# Patient Record
Sex: Female | Born: 1960 | Race: White | Hispanic: No | Marital: Married | Smoking: Never smoker
Health system: Southern US, Community
[De-identification: ages and names within clinical notes are randomized; demographics above are authoritative.]

## PROBLEM LIST (undated history)

## (undated) DIAGNOSIS — G43909 Migraine, unspecified, not intractable, without status migrainosus: Secondary | ICD-10-CM

## (undated) HISTORY — DX: Migraine, unspecified, not intractable, without status migrainosus: G43.909

---

## 1994-09-18 HISTORY — PX: BREAST LUMPECTOMY: SHX2

## 2000-08-08 ENCOUNTER — Ambulatory Visit (HOSPITAL_BASED_OUTPATIENT_CLINIC_OR_DEPARTMENT_OTHER): Admission: RE | Admit: 2000-08-08 | Discharge: 2000-08-08 | Payer: Self-pay | Admitting: Plastic Surgery

## 2004-09-13 ENCOUNTER — Encounter: Admission: RE | Admit: 2004-09-13 | Discharge: 2004-09-13 | Payer: Self-pay | Admitting: *Deleted

## 2004-09-27 ENCOUNTER — Encounter: Admission: RE | Admit: 2004-09-27 | Discharge: 2004-09-27 | Payer: Self-pay | Admitting: *Deleted

## 2004-09-28 ENCOUNTER — Ambulatory Visit: Payer: Self-pay | Admitting: Oncology

## 2004-09-28 ENCOUNTER — Encounter: Admission: RE | Admit: 2004-09-28 | Discharge: 2004-09-28 | Payer: Self-pay

## 2004-09-29 ENCOUNTER — Encounter (INDEPENDENT_AMBULATORY_CARE_PROVIDER_SITE_OTHER): Payer: Self-pay | Admitting: *Deleted

## 2004-09-29 ENCOUNTER — Encounter: Admission: RE | Admit: 2004-09-29 | Discharge: 2004-09-29 | Payer: Self-pay | Admitting: *Deleted

## 2004-11-28 ENCOUNTER — Ambulatory Visit: Payer: Self-pay | Admitting: Oncology

## 2007-02-25 ENCOUNTER — Encounter: Admission: RE | Admit: 2007-02-25 | Discharge: 2007-02-25 | Payer: Self-pay | Admitting: Cardiology

## 2007-03-10 ENCOUNTER — Encounter: Admission: RE | Admit: 2007-03-10 | Discharge: 2007-03-10 | Payer: Self-pay | Admitting: Orthopedic Surgery

## 2007-03-14 ENCOUNTER — Ambulatory Visit: Payer: Self-pay | Admitting: Internal Medicine

## 2007-03-18 ENCOUNTER — Ambulatory Visit: Payer: Self-pay | Admitting: Internal Medicine

## 2007-03-18 LAB — CONVERTED CEMR LAB
ALT: 20 units/L (ref 0–35)
AST: 23 units/L (ref 0–37)
Albumin: 3.7 g/dL (ref 3.5–5.2)
Alkaline Phosphatase: 37 units/L — ABNORMAL LOW (ref 39–117)
Bilirubin, Direct: 0.1 mg/dL (ref 0.0–0.3)
Total Bilirubin: 0.7 mg/dL (ref 0.3–1.2)
Total Protein: 6.6 g/dL (ref 6.0–8.3)

## 2008-01-18 DIAGNOSIS — D1803 Hemangioma of intra-abdominal structures: Secondary | ICD-10-CM | POA: Insufficient documentation

## 2008-01-18 HISTORY — DX: Hemangioma of intra-abdominal structures: D18.03

## 2008-02-11 IMAGING — US US ABDOMEN COMPLETE
1 series · 13 of 25 positions shown · non-contrast
Comparison: none

ACCESSION NUMBER:  [id number].

 READING PHYSICIAN:  Ronlor, Ingunn Harpa.
 PROCEDURE:  Multiplanar abdominal ultrasound imaging was performed in the upright, supine, right and left lateral decubitus positions.
 INDICATIONS FOR PROCEDURE:  Abnormal MRI with cysts on the livers.
 RESULTS:  ABDOMINAL AORTA:  Abdominal aorta is 16 mm. The abdominal aorta appears normal.  The IVC is patent. 
 PANCREAS:  The pancreas appears normal throughout the head, body and tail without evidence of ductal dilatation, pancreatic masses or peripancreatic inflammation. 
 GALLBLADDER:  Gallbladder is well distended, thin walled, with no pericholecystic fluid or intraluminal echogenic foci to suggest gallstone disease. Normal wall thickness of 2.7 mm. 
 COMMON BILE DUCT:  The common bile duct measures 2.2 mm in maximal diameter and appears normal without evidence of intraluminal foci. 
 LIVER:  The liver appears normal without evidence of parenchymal lesion, ductal dilatation or vascular abnormality. Normal size and normal echodensity with 2 hyperechoic areas in the right lobe of the liver, 117 mm and 115 mm in diameter. No evidence of capsule or cystic lesion. Both appear consistent with hemangioma's.
 KIDNEYS:  Kidneys measure 99 mm right, 102 mm left.  Both kidneys are normal in appearance. 
 SPLEEN:  The spleen is normal in size, measuring 92 mm without parenchymal lesion.

[Series 1: us abdomen complete · 0.26mm/px · 13 of 58 slices shown]
[im 1/58]
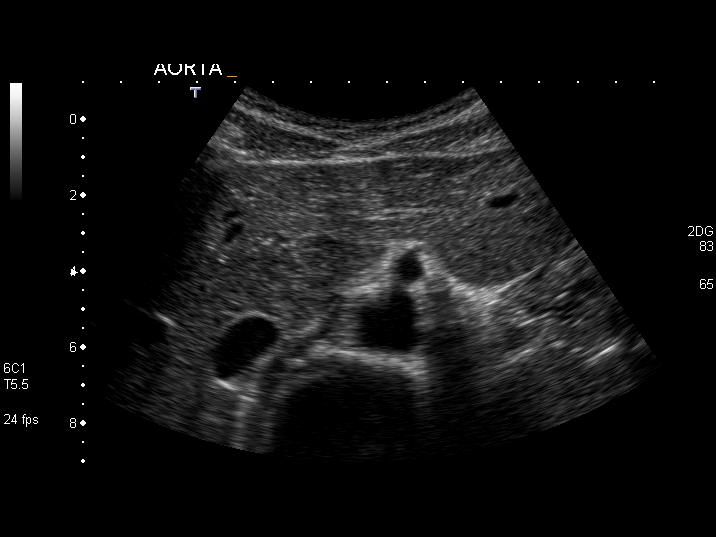
[im 5/58]
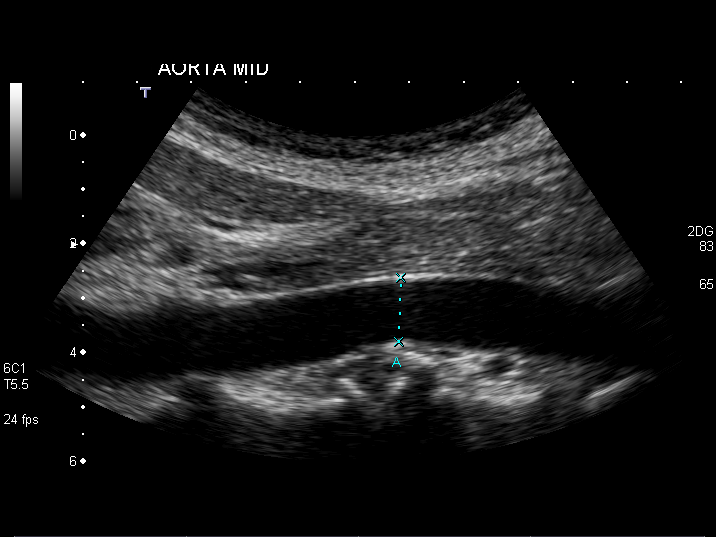
[im 10/58]
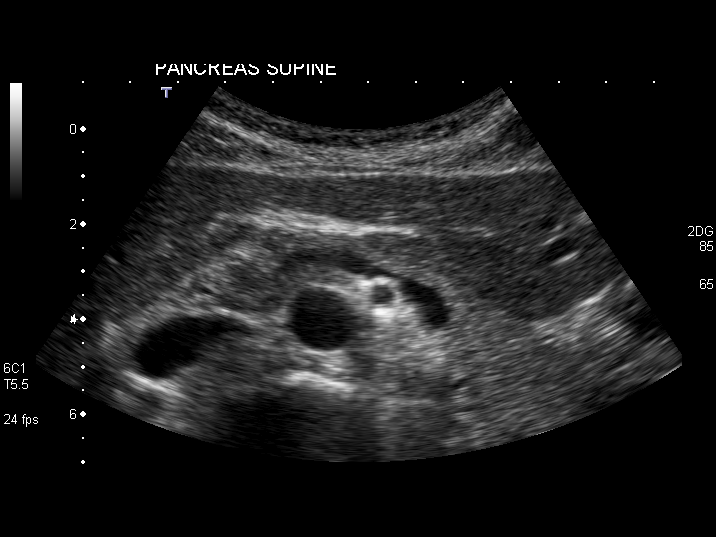
[im 15/58]
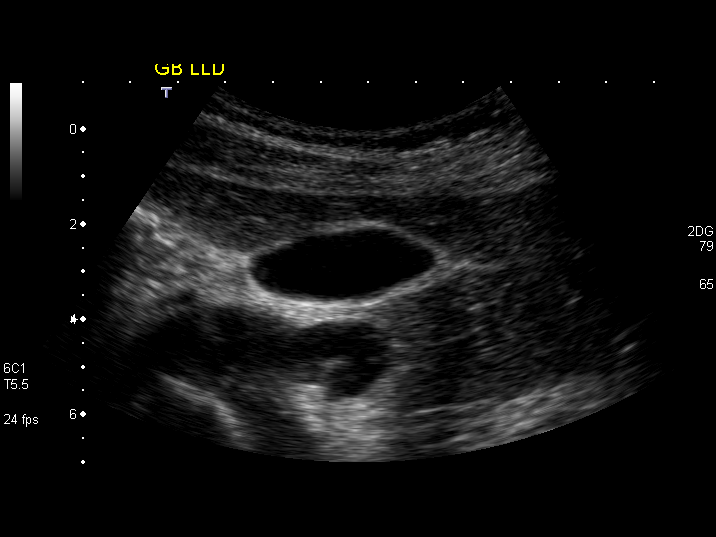
[im 20/58]
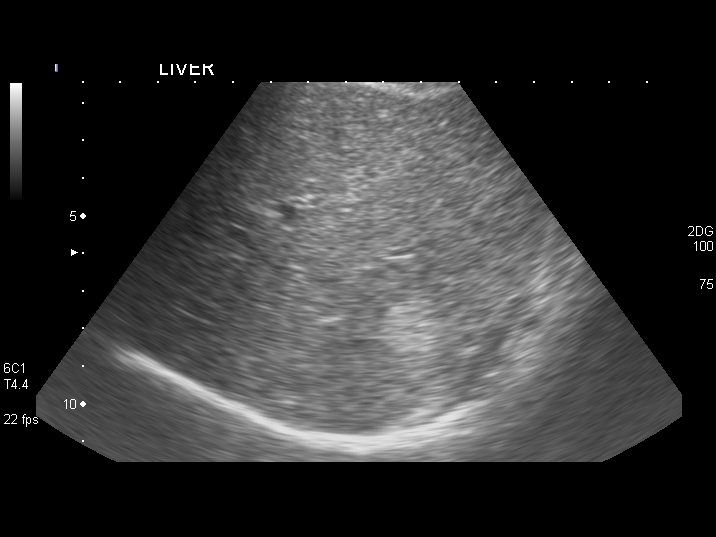
[im 24/58]
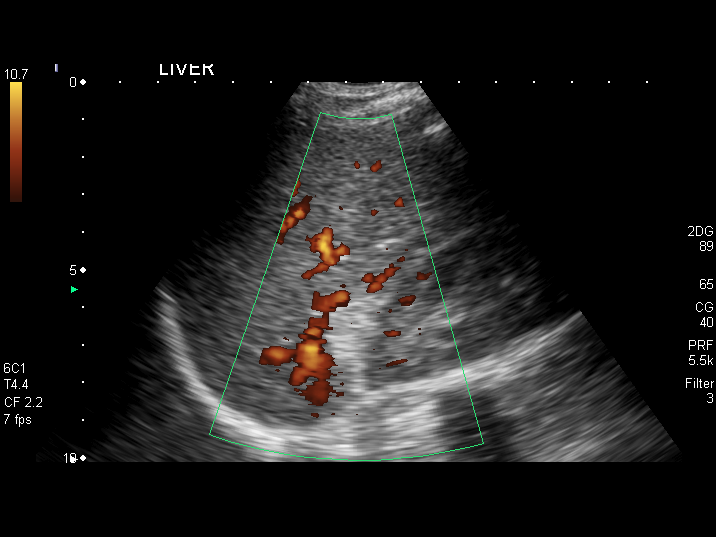
[im 29/58]
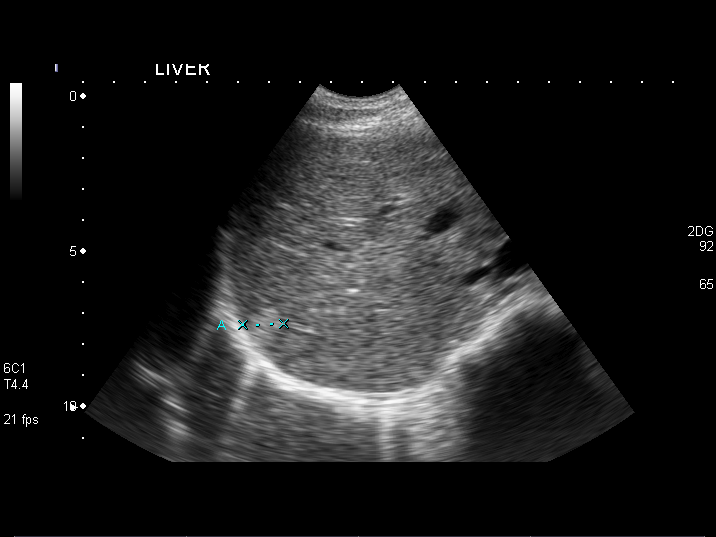
[im 34/58]
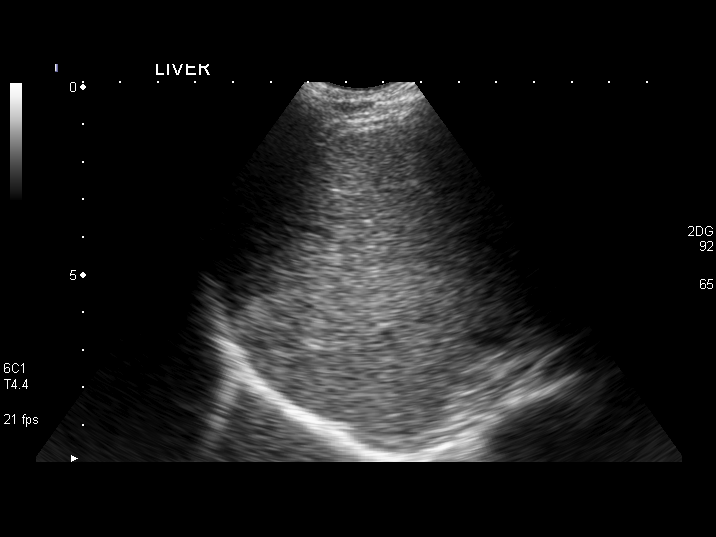
[im 39/58]
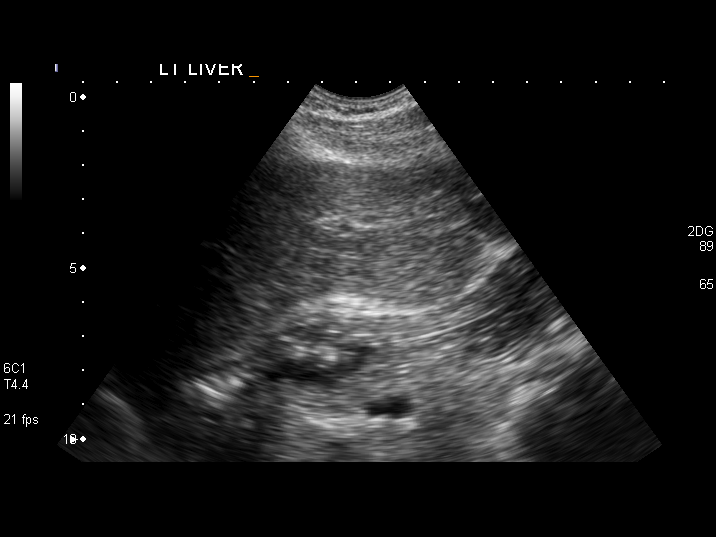
[im 43/58]
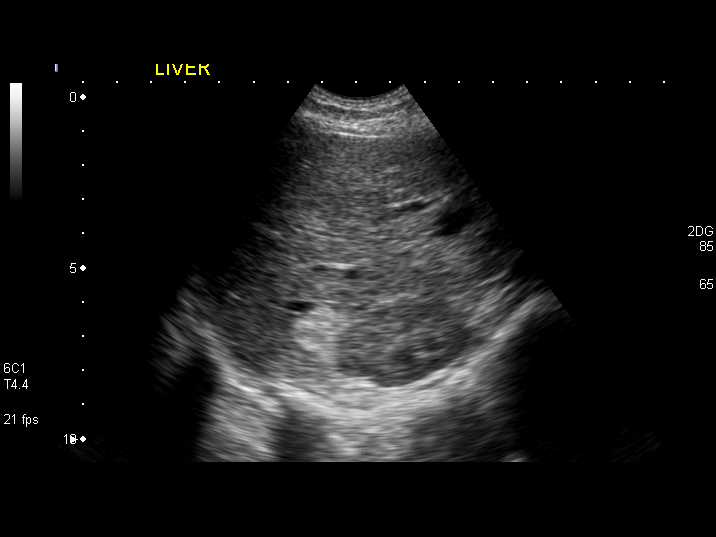
[im 48/58]
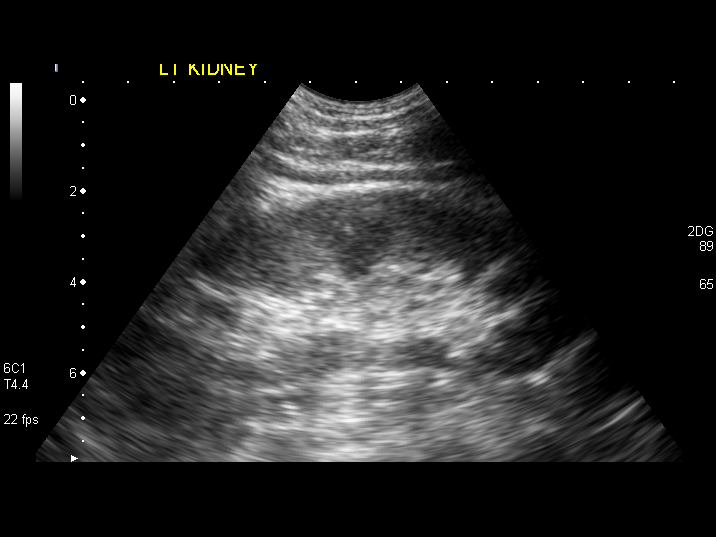
[im 53/58]
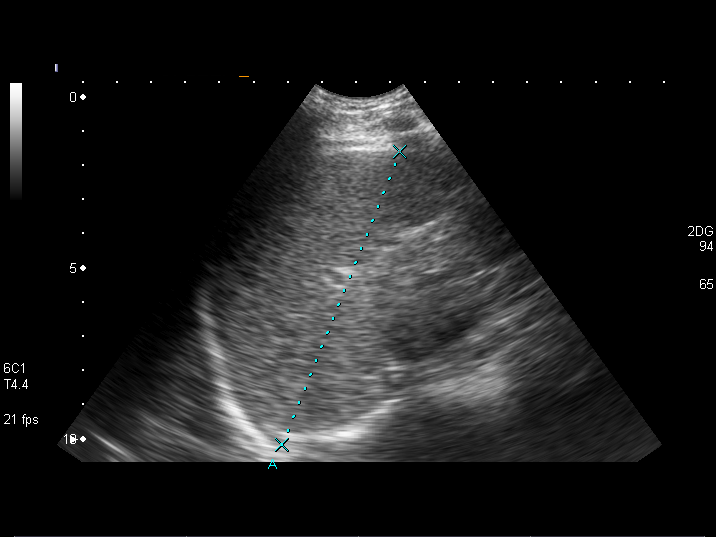
[im 58/58]
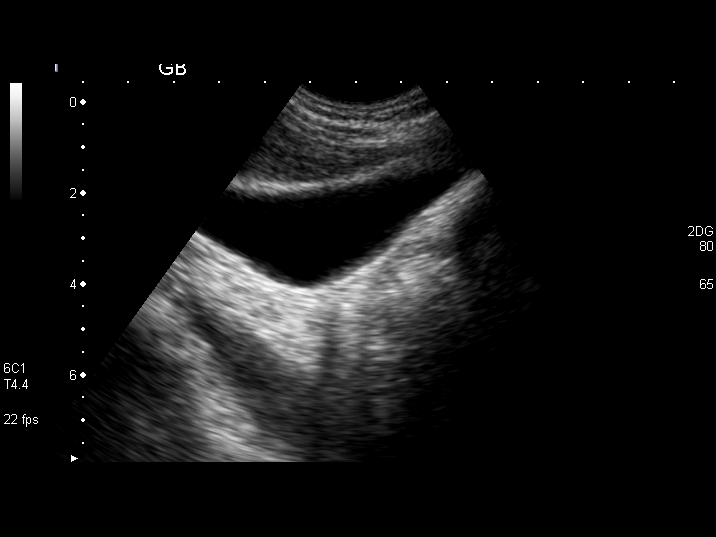

[13 of 25 positions shown; findings below may reference images not displayed]

IMPRESSION: Two (2) liver lesions, right lobe of the liver, 15 mm and 17 mm, both consistent with hemangioma's, to focal nodular hyperplasia, less likely adenoma's.

 PLAN:  MRI of the liver.

## 2009-12-14 ENCOUNTER — Encounter: Payer: Self-pay | Admitting: Cardiology

## 2010-01-10 ENCOUNTER — Encounter: Payer: Self-pay | Admitting: Cardiology

## 2010-01-10 DIAGNOSIS — D649 Anemia, unspecified: Secondary | ICD-10-CM

## 2010-01-10 HISTORY — DX: Anemia, unspecified: D64.9

## 2010-06-29 ENCOUNTER — Ambulatory Visit: Payer: Self-pay | Admitting: Internal Medicine

## 2010-06-29 ENCOUNTER — Ambulatory Visit (HOSPITAL_COMMUNITY): Admission: RE | Admit: 2010-06-29 | Discharge: 2010-06-29 | Payer: Self-pay | Admitting: Internal Medicine

## 2010-10-05 ENCOUNTER — Encounter: Payer: Self-pay | Admitting: Cardiology

## 2010-10-05 DIAGNOSIS — M810 Age-related osteoporosis without current pathological fracture: Secondary | ICD-10-CM | POA: Insufficient documentation

## 2010-10-05 HISTORY — DX: Age-related osteoporosis without current pathological fracture: M81.0

## 2010-10-09 ENCOUNTER — Encounter: Payer: Self-pay | Admitting: Internal Medicine

## 2010-10-18 NOTE — Miscellaneous (Signed)
Summary: Orders Update  Clinical Lists Changes  Problems: Added new problem of ANEMIA (ICD-285.9) Orders: Added new Test order of T-Comprehensive Metabolic Panel [phone]) - Signed Added new Test order of T-CBC No Diff ([id number]) - Signed Added new Test order of T-Protime, Auto [phone]) - Signed Added new Test order of T-PTT [phone]) - Signed Added new Test order of T-TSH [phone]) - Signed Added new Test order of T-Sed Rate (Automated) [phone]) - Signed Added new Test order of T-Vitamin B12 ([id number]) - Signed Added new Test order of T-Iron [phone]) - Signed Added new Test order of T-Ferritin ([id number]) - Signed Added new Test order of T- * Misc. Laboratory test [phone]) - Signed Added new Test order of T-Folic Acid; RBC ([id number]) - Signed Added new Test order of T-Hemoccult Cards-Multiple (22025) - Signed

## 2010-10-18 NOTE — Miscellaneous (Signed)
Summary: rx - iron  Clinical Lists Changes  Medications: Added new medication of FERROUS SULFATE 325 (65 FE) MG TBEC (FERROUS SULFATE) Take 1 tablet by mouth three times a day - Signed Rx of FERROUS SULFATE 325 (65 FE) MG TBEC (FERROUS SULFATE) Take 1 tablet by mouth three times a day;  #90 x 3;  Signed;  Entered by: Hoover Brunette, LPN;  Authorized by: Lewayne Bunting, MD, [facility];  Method used: Electronically to [facility] [phone]*, [address], [place], [place], [place]  [address], Ph: [phone] or [phone], Fax: [phone]    Prescriptions: FERROUS SULFATE 325 (65 FE) MG TBEC (FERROUS SULFATE) Take 1 tablet by mouth three times a day  #90 x 3   Entered by:   Hoover Brunette, LPN   Authorized by:   Lewayne Bunting, MD, [facility]   Signed by:   Hoover Brunette, LPN on 41/66/0630   Method used:   Electronically to        [facility] [address] [phone]* (retail)       [address]       [place], [place]  [address]       Ph: [phone] or [phone]       Fax: [phone]   RxID:   [id number]

## 2010-10-20 NOTE — Miscellaneous (Signed)
Summary: Orders Update  Clinical Lists Changes  Problems: Added new problem of OSTEOPOROSIS (ICD-733.00) Orders: Added new Test order of T-Ferritin [phone]) - Signed Added new Test order of T- * Misc. Laboratory test [phone]) - Signed

## 2011-01-31 NOTE — Assessment & Plan Note (Signed)
[facility]                         GASTROENTEROLOGY OFFICE NOTE   NAME:[name], [name]                         MRN:          [id number]  DATE:03/18/2007                            DOB:          26-Nov-1960    [name] [name] is a 50 year old white female, wife of Dr. Andee Lineman, who is here  today to evaluate abnormality of a final MRI of the thoracic spine  ordered by Dr. Andee Lineman, which was done on February 25, 2007 and which showed  incidental hyperintense lesion in the posterior liver on the right,  measuring 10x15 mm.  It was not clear from the MRI whether this was a  cystic lesion, a solid lesion.   [name] [name] is asymptomatic as far as the liver lesions are concerned.  She  has never had hepatitis, jaundice, GI symptoms suggestive of gallbladder  disease.  She took birth control pills for a period of 2 or 3 years in  her 85s.   Other medications include multivitamins and calcium supplements.   PAST HISTORY:  Negative for cardiovascular, thyroid, or  genitourinary  problems.   OPERATIONS:  None.   FAMILY HISTORY:  Positive for prostate cancer in her father.   SOCIAL HISTORY:  Married, no children.  She is a Designer, jewellery.  She  drinks alcohol socially and does not smoke.   REVIEW OF SYSTEMS:  Positive for night sweats.   PHYSICAL EXAMINATION:  Blood pressure 100/64, pulse 72 and weight 109  pounds.  She was very pleasant and healthy appearing.  SKIN:  No stigmata of chronic liver disease. Specifically no palmar  erythema, spider nevi.  Sclerae is nonicteric.  NECK:  Supple, no lymphadenopathy.  LUNGS:  Clear to auscultation.  COR:  With normal S1, normal S2.  ABDOMEN:  Soft, scaphoid,  nontender.  Liver edge was at costal margin  and by percussion measured about 6 to 7 cm overall. It was not tender.  EXTREMITIES:  No edema.   Upper abdominal ultrasound done in our office last week in order to  further evaluate the lesions showed actually 2 solid lesions  suggestive  of hemangioma, both in right lobe of the liver measuring 17 mm and 15 mm  respectively.  There was no encapsulation.  There were no dilated intra-  hepatic ducts or any other focal lesions.  Liver size was normal.  Gallbladder also appeared normal.  Splenic size was normal.  The  differential diagnosis of the lesions was focal nodular hyperplasia or  possibly adenoma.   IMPRESSION:  A 50 year old white female with incidental findings of 2  small lesions in the right lobe of the liver, defined as most likely to  be hemangioma, although there is a possibility that these could  represent focal nodule hyperplasia or area of adenomas.  The appearance  of them is more suggestive of hemangioma.   PLAN:  I have discussed the options for a definitive diagnosis with  [name] [name] as well as her husband, [name] [name].  I would prefer to follow these  lesions and to obtain at least 1 ultrasound in a period of 1 year to  assure that these lesions remained the same size.  In addition to that,  we will obtain liver function test today.  The other option would be to  go ahead with dedicated MRI of the liver, which would further define the  lesions as either vascular or nonvascular.  Considering the fact that  she already had an MRI within the last 4 weeks, I would lean towards the  more conservative approach.  If her upper abdominal ultrasound showed  this lesion to be exactly the same in 1 year and her liver function  tests are normal, we will either repeat the ultrasound in 3 to 5 years  following that or just not follow it in any standard way.  [name] [name] was  satisfied with the plan.  We will send her a reminder letter for the  above abdominal ultrasound for next June 2009.  As far as using estrogen  for birth control pills, I would stay away from them for the time being,  since we really do not know whether these lesions may not be estrogen  sensitive.     [name] [name]. [name] Chance, MD  Electronically  Signed    DMB/MedQ  DD: 03/18/2007  DT: 03/18/2007  Job #: [id number]   cc:   Learta Codding, MD,FACC  G. Dorene Grebe, M.D.

## 2011-07-24 ENCOUNTER — Other Ambulatory Visit: Payer: Self-pay | Admitting: *Deleted

## 2011-07-24 MED ORDER — FERROUS SULFATE 325 (65 FE) MG PO TABS: 325.0000 mg | ORAL_TABLET | Freq: Three times a day (TID) | ORAL | Status: DC

## 2011-12-20 ENCOUNTER — Other Ambulatory Visit: Payer: Self-pay | Admitting: *Deleted

## 2011-12-20 DIAGNOSIS — D649 Anemia, unspecified: Secondary | ICD-10-CM

## 2011-12-21 ENCOUNTER — Other Ambulatory Visit: Payer: Self-pay | Admitting: Cardiology

## 2011-12-21 LAB — CBC
HCT: 36.5 % (ref 36.0–46.0)
Hemoglobin: 11.7 g/dL — ABNORMAL LOW (ref 12.0–15.0)
MCH: 29.6 pg (ref 26.0–34.0)
MCHC: 32.1 g/dL (ref 30.0–36.0)
MCV: 92.4 fL (ref 78.0–100.0)
Platelets: 304 10*3/uL (ref 150–400)
RBC: 3.95 MIL/uL (ref 3.87–5.11)
RDW: 13.5 % (ref 11.5–15.5)
WBC: 6.9 10*3/uL (ref 4.0–10.5)

## 2011-12-21 LAB — FERRITIN: Ferritin: 16 ng/mL (ref 10–291)

## 2012-02-14 ENCOUNTER — Other Ambulatory Visit: Payer: Self-pay | Admitting: Plastic Surgery

## 2014-12-31 ENCOUNTER — Other Ambulatory Visit: Payer: Self-pay | Admitting: Surgery

## 2017-03-01 ENCOUNTER — Ambulatory Visit (INDEPENDENT_AMBULATORY_CARE_PROVIDER_SITE_OTHER): Payer: BLUE CROSS/BLUE SHIELD | Admitting: Podiatry

## 2017-03-01 ENCOUNTER — Ambulatory Visit (INDEPENDENT_AMBULATORY_CARE_PROVIDER_SITE_OTHER): Payer: BLUE CROSS/BLUE SHIELD

## 2017-03-01 ENCOUNTER — Encounter: Payer: Self-pay | Admitting: Podiatry

## 2017-03-01 VITALS — BP 103/74 | HR 75 | Resp 16 | Ht 64.0 in | Wt 116.0 lb

## 2017-03-01 DIAGNOSIS — M79671 Pain in right foot: Secondary | ICD-10-CM

## 2017-03-01 DIAGNOSIS — M21619 Bunion of unspecified foot: Secondary | ICD-10-CM

## 2017-03-01 DIAGNOSIS — M2041 Other hammer toe(s) (acquired), right foot: Secondary | ICD-10-CM

## 2017-03-01 DIAGNOSIS — M79672 Pain in left foot: Principal | ICD-10-CM

## 2017-03-01 DIAGNOSIS — D492 Neoplasm of unspecified behavior of bone, soft tissue, and skin: Secondary | ICD-10-CM

## 2017-03-01 NOTE — Progress Notes (Signed)
   Subjective:    Patient ID: [name] [name], female    DOB: 1960/11/23, 56 y.o.   MRN: [id number]  HPI Chief Complaint  Patient presents with  . Foot Pain    Bilateral; bunions  . Cyst    Right foot; medial side-below great toe; pt stated, "it is not hard and feels like it is on a tendong"; x3 months      Review of Systems  All other systems reviewed and are negative.      Objective:   Physical Exam        Assessment & Plan:

## 2017-03-01 NOTE — Patient Instructions (Signed)

## 2017-03-02 NOTE — Progress Notes (Signed)
Subjective:    Patient ID: Pearson Forster, female   DOB: 56 y.o.   MRN: [id number]   HPI patient presents stating I have a cyst on my right first metatarsal and also I have bunion deformities and I know immediately get fixed right over left with increased pain and I've tried to wear wider shoes and other modalities    Review of Systems  All other systems reviewed and are negative.       Objective:  Physical Exam  Cardiovascular: Intact distal pulses.   Musculoskeletal: Normal range of motion.  Neurological: She is alert.  Skin: Skin is warm.  Nursing note and vitals reviewed.  neurovascular status intact muscle strength adequate range of motion within normal limits with patient found to have large bunion deformity right over left with redness around the joint surface and the cyst proximal to the first metatarsal measuring approximately 1 cm x 1 cm that appears to be freely movable within subcutaneous tissue. 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 At this point I do think long-term surgical intervention is indicated she does want to get this done but needs to wait until later in the year and we'll focus on the cyst first. I did a proximal nerve block I then aspirated getting out a gelatinous material and injected a small amount steroid and applied compression dressing. I also discussed sites bunion correction right which will have to be aggressive and may not get complete clinical correction A digital procedure distal second right with removal of mucoid cyst. Patient will reappoint for consult  X-ray indicates that there is elevation of the intermetatarsal angle both  right and left foot a proximally 17

## 2022-05-28 DIAGNOSIS — N6489 Other specified disorders of breast: Secondary | ICD-10-CM | POA: Insufficient documentation

## 2022-06-15 ENCOUNTER — Telehealth: Payer: Self-pay

## 2022-06-15 DIAGNOSIS — I480 Paroxysmal atrial fibrillation: Secondary | ICD-10-CM

## 2022-06-15 NOTE — Telephone Encounter (Signed)
Called patient and left message informing her that I've put her on schedule to see Dr Burt Knack Tuesday 06/20/22 at 10am. Order placed at this time for stress ECHO.

## 2022-06-15 NOTE — Telephone Encounter (Signed)
-----   Message from Sherren Mocha, MD sent at 06/15/2022  9:26 AM EDT ----- [name] [name] - this is a new patient for me. Dx: paroxysmal atrial fibrillation. Can you set her up for a stress echo and OV with me next Tuesday? Stress echo whenever availability.   thx

## 2022-06-20 ENCOUNTER — Encounter: Payer: Self-pay | Admitting: Cardiovascular Disease

## 2022-06-20 ENCOUNTER — Ambulatory Visit: Payer: Managed Care, Other (non HMO)

## 2022-06-20 ENCOUNTER — Ambulatory Visit: Payer: Managed Care, Other (non HMO) | Attending: Cardiovascular Disease | Admitting: Cardiovascular Disease

## 2022-06-20 VITALS — BP 100/78 | HR 65 | Ht 64.0 in | Wt 125.0 lb

## 2022-06-20 DIAGNOSIS — I480 Paroxysmal atrial fibrillation: Secondary | ICD-10-CM

## 2022-06-20 MED ORDER — PROPRANOLOL HCL 20 MG PO TABS: 20.0000 mg | ORAL_TABLET | Freq: Three times a day (TID) | ORAL | 11 refills | Status: DC

## 2022-06-20 NOTE — Patient Instructions (Signed)
Medication Instructions:  STOP Flecainide START Propranolol '20mg'$  three times daily *If you need a refill on your cardiac medications before your next appointment, please call your pharmacy*   Lab Work: NONE If you have labs (blood work) drawn today and your tests are completely normal, you will receive your results only by: [place] (if you have MyChart) OR A paper copy in the mail If you have any lab test that is abnormal or we need to change your treatment, we will call you to review the results.   Testing/Procedures: 14 day Zio monitor Your physician has recommended that you wear an event monitor. Event monitors are medical devices that record the heart's electrical activity. Doctors most often [place] these monitors to diagnose arrhythmias. Arrhythmias are problems with the speed or rhythm of the heartbeat. The monitor is a small, portable device. You can wear one while you do your normal daily activities. This is usually used to diagnose what is causing palpitations/syncope (passing out).  Follow-Up: At [facility], you and your health needs are our priority.  As part of our continuing mission to provide you with exceptional heart care, we have created designated Provider Care Teams.  These Care Teams include your primary Cardiologist (physician) and Advanced Practice Providers (APPs -  Physician Assistants and Nurse Practitioners) who all work together to provide you with the care you need, when you need it.  Your next appointment:   6 months  The format for your next appointment:   In Person  Provider:   Sherren Mocha, MD  Other Instructions ZIO XT- Long Term Monitor Instructions  Your physician has requested you wear a ZIO patch monitor for 14 days.  This is a single patch monitor. [employer] supplies one patch monitor per enrollment. Additional stickers are not available. Please do not apply patch if you will be having a Nuclear Stress Test,  Echocardiogram,  Cardiac CT, MRI, or Chest Xray during the period you would be wearing the  monitor. The patch cannot be worn during these tests. You cannot remove and re-apply the  ZIO XT patch monitor.  Your ZIO patch monitor will be mailed 3 day USPS to your address on file. It may take 3-5 days  to receive your monitor after you have been enrolled.  Once you have received your monitor, please review the enclosed instructions. Your monitor  has already been registered assigning a specific monitor serial # to you.  Billing and Patient Assistance Program Information  We have supplied [employer] with any of your insurance information on file for billing purposes. [employer] offers a sliding scale Patient Assistance Program for patients that do not have  insurance, or whose insurance does not completely cover the cost of the ZIO monitor.  You must apply for the Patient Assistance Program to qualify for this discounted rate.  To apply, please call [employer] at [phone], select option 4, select option 2, ask to apply for  Patient Assistance Program. Theodore Demark will ask your household income, and how many people  are in your household. They will quote your out-of-pocket cost based on that information.  [employer] will also be able to set up a 57-month interest-free payment plan if needed.  Applying the monitor   Shave hair from upper left chest.  Hold abrader disc by orange tab. Rub abrader in 40 strokes over the upper left chest as  indicated in your monitor instructions.  Clean area with 4 enclosed alcohol pads. Let dry.  Apply patch as indicated  in monitor instructions. Patch will be placed under collarbone on left  side of chest with arrow pointing upward.  Rub patch adhesive wings for 2 minutes. Remove white label marked "1". Remove the white  label marked "2". Rub patch adhesive wings for 2 additional minutes.  While looking in a mirror, press and release button in center of patch. A small green light will   flash 3-4 times. This will be your only indicator that the monitor has been turned on.  Do not shower for the first 24 hours. You may shower after the first 24 hours.  Press the button if you feel a symptom. You will hear a small click. Record Date, Time and  Symptom in the Patient Logbook.  When you are ready to remove the patch, follow instructions on the last 2 pages of Patient  Logbook. Stick patch monitor onto the last page of Patient Logbook.  Place Patient Logbook in the blue and white box. Use locking tab on box and tape box closed  securely. The blue and white box has prepaid postage on it. Please place it in the mailbox as  soon as possible. Your physician should have your test results approximately 7 days after the  monitor has been mailed back to [facility].  Call [facility] at [phone] if you have questions regarding  your ZIO XT patch monitor. Call them immediately if you see an orange light blinking on your  monitor.  If your monitor falls off in less than 4 days, contact our Monitor department at [phone].  If your monitor becomes loose or falls off after 4 days call [employer] at [phone] for  suggestions on securing your monitor   Important Information About [employer]

## 2022-06-20 NOTE — Progress Notes (Unsigned)
Enrolled for [employer] to mail a ZIO XT long term holter monitor to the patients address on file.  

## 2022-06-20 NOTE — Progress Notes (Signed)
Cardiology Office Note:    Date:  06/20/2022   ID:  [name] [name], DOB September 12, 1961, MRN [id number]  PCP:  No primary care provider on file.   [place] Providers Cardiologist:  Sherren Mocha, MD     Referring MD: No ref. provider found   Chief Complaint  Patient presents with   Atrial Fibrillation    History of Present Illness:    [name] [name] is a 61 y.o. female presenting for evaluation of atrial fibrillation.  The patient is here alone today.  She has no personal history of hypertension, diabetes, hyperlipidemia, or coronary artery disease.  She has had episodes of heart palpitations intermittently over the past few years.  In January of this year she was awakened from sleep with fast heart rate and heart palpitations.  She recorded that event and I am able to review the rhythm strips today which confirm atrial fibrillation with RVR.  The episode lasted several hours and resolved with magnesium, potassium, and a dose of flecainide.  She subsequently has had more frequent heart palpitations but no other confirmed episodes of atrial fibrillation.  She started taking flecainide 50 mg twice daily on a regular basis about 2 weeks ago.  She has noted some improvement in her heart palpitations since that time.  She was switched to long-acting propranolol but felt like her symptoms were somewhat better controlled on the shorter acting form.  She has had no exertional symptoms of chest pain, chest pressure, or shortness of breath.  She has been otherwise healthy with no medical problems.  Past Medical History:  Diagnosis Date   ANEMIA 01/10/2010   Qualifier: Diagnosis of  By: Laurance Flatten RN, BSN, [facility]     LIVER HEMANGIOMA 01/18/2008   Qualifier: Diagnosis of  By: Marland Mcalpine     Migraines    OSTEOPOROSIS 10/05/2010   Qualifier: Diagnosis of  By: Berdine Addison LPN, Edd Fabian      Past Surgical History:  Procedure Laterality Date   BREAST LUMPECTOMY  1996    Current  Medications: Current Meds  Medication Sig   Multiple Vitamin (MULTIVITAMIN) tablet Take 1 tablet by mouth daily.   propranolol (INDERAL) 20 MG tablet Take 1 tablet (20 mg total) by mouth 3 (three) times daily.   [DISCONTINUED] flecainide (TAMBOCOR) 50 MG tablet Take 50 mg by mouth 2 (two) times daily.   [DISCONTINUED] propranolol ER (INDERAL LA) 60 MG 24 hr capsule Take 60 mg by mouth daily.     Allergies:   Patient has no known allergies.   Social History   Socioeconomic History   Marital status: Married    Spouse name: Not on file   Number of children: Not on file   Years of education: Not on file   Highest education level: Not on file  Occupational History   Not on file  Tobacco Use   Smoking status: Never   Smokeless tobacco: Never  Substance and Sexual Activity   Alcohol use: Not on file   Drug use: Not on file   Sexual activity: Not on file  Other Topics Concern   Not on file  Social History Narrative   Not on file   Social Determinants of Health   Financial Resource Strain: Not on file  Food Insecurity: Not on file  Transportation Needs: Not on file  Physical Activity: Not on file  Stress: Not on file  Social Connections: Not on file     Family History: The patient's family history includes Diabetes in  her maternal grandmother; Hypertension in her maternal grandmother; Kidney failure in her maternal grandmother; Prostate cancer in her father. No family history of atrial fibrillation  ROS:   Please see the history of present illness.    All other systems reviewed and are negative.  EKGs/Labs/Other Studies Reviewed:    The following studies were reviewed today: none  EKG:  EKG is ordered today.  The ekg ordered today demonstrates normal sinus rhythm 65 bpm, cannot rule out septal infarct age undetermined, otherwise normal.  Recent Labs: No results found for requested labs within last 365 days.  Recent Lipid Panel No results found for: "CHOL", "TRIG",  "HDL", "CHOLHDL", "VLDL", "LDLCALC", "LDLDIRECT"   Risk Assessment/Calculations:    CHA2DS2-VASc Score = 1   This indicates a 0.6% annual risk of stroke. The patient's score is based upon: CHF History: 0 HTN History: 0 Diabetes History: 0 Stroke History: 0 Vascular Disease History: 0 Age Score: 0 Gender Score: 1                Physical Exam:    VS:  BP 100/78 (BP Location: Left Arm, Patient Position: Sitting, Cuff Size: Normal)   Pulse 65   Ht '5\' 4"'$  (1.626 m)   Wt 125 lb (56.7 kg)   SpO2 98%   BMI 21.46 kg/m     Wt Readings from Last 3 Encounters:  06/20/22 125 lb (56.7 kg)  03/01/17 116 lb (52.6 kg)     GEN:  Well nourished, well developed in no acute distress HEENT: Normal NECK: No JVD; No carotid bruits LYMPHATICS: No lymphadenopathy CARDIAC: RRR, no murmurs, rubs, gallops RESPIRATORY:  Clear to auscultation without rales, wheezing or rhonchi  ABDOMEN: Soft, non-tender, non-distended MUSCULOSKELETAL:  No edema; No deformity  SKIN: Warm and dry NEUROLOGIC:  Alert and oriented x 3 PSYCHIATRIC:  Normal affect   ASSESSMENT:    1. PAF (paroxysmal atrial fibrillation) (HCC)    PLAN:    In order of problems listed above:  The patient has paroxysmal atrial fibrillation, currently controlled on flecainide and long-acting propranolol.  We discussed pros and cons of antiarrhythmic drug therapy and in her case it is primarily for symptom control.  After shared decision-making conversation, we have decided to discontinue flecainide.  We will change her back to propranolol 20 mg 3 times daily using the short acting form which she found to be more efficacious.  She has stopped routinely taking her migraine headache medicine and feels that this was contributing to her palpitations.  I am going to check a ZIO monitor for 2 weeks of monitoring.  This will help evaluate the presence of atrial fibrillation or other supraventricular arrhythmias.  I am going to check a stress  echocardiogram to evaluate for any structural or ischemic heart disease as she may require flecainide again in the future.  I plan to see the patient back in 6 months.  She has had lab monitoring done by her primary physician.  We will send for a copy of her labs so that we can review her thyroid function and other lab data.  With a CHA2DS2-VASc score of 1 and the only positive feature being female gender, she does not require oral anticoagulation.      Shared Decision Making/Informed Consent The risks [chest pain, shortness of breath, cardiac arrhythmias, dizziness, blood pressure fluctuations, myocardial infarction, stroke/transient ischemic attack, and life-threatening complications (estimated to be 1 in 10,000)], benefits (risk stratification, diagnosing coronary artery disease, treatment guidance) and alternatives of a  stress or dobutamine stress echocardiogram were discussed in detail with Ms. Fellner and she agrees to proceed.    Medication Adjustments/Labs and Tests Ordered: Current medicines are reviewed at length with the patient today.  Concerns regarding medicines are outlined above.  Orders Placed This Encounter  Procedures   LONG TERM MONITOR (3-14 DAYS)   EKG 12-Lead   Meds ordered this encounter  Medications   propranolol (INDERAL) 20 MG tablet    Sig: Take 1 tablet (20 mg total) by mouth 3 (three) times daily.    Dispense:  90 tablet    Refill:  11    Patient Instructions  Medication Instructions:  STOP Flecainide START Propranolol '20mg'$  three times daily *If you need a refill on your cardiac medications before your next appointment, please call your pharmacy*   Lab Work: NONE If you have labs (blood work) drawn today and your tests are completely normal, you will receive your results only by: [place] (if you have MyChart) OR A paper copy in the mail If you have any lab test that is abnormal or we need to change your treatment, we will call you to review the  results.   Testing/Procedures: 14 day Zio monitor Your physician has recommended that you wear an event monitor. Event monitors are medical devices that record the heart's electrical activity. Doctors most often [place] these monitors to diagnose arrhythmias. Arrhythmias are problems with the speed or rhythm of the heartbeat. The monitor is a small, portable device. You can wear one while you do your normal daily activities. This is usually used to diagnose what is causing palpitations/syncope (passing out).  Follow-Up: At [facility], you and your health needs are our priority.  As part of our continuing mission to provide you with exceptional heart care, we have created designated Provider Care Teams.  These Care Teams include your primary Cardiologist (physician) and Advanced Practice Providers (APPs -  Physician Assistants and Nurse Practitioners) who all work together to provide you with the care you need, when you need it.  Your next appointment:   6 months  The format for your next appointment:   In Person  Provider:   Sherren Mocha, MD  Other Instructions ZIO XT- Long Term Monitor Instructions  Your physician has requested you wear a ZIO patch monitor for 14 days.  This is a single patch monitor. [employer] supplies one patch monitor per enrollment. Additional stickers are not available. Please do not apply patch if you will be having a Nuclear Stress Test,  Echocardiogram, Cardiac CT, MRI, or Chest Xray during the period you would be wearing the  monitor. The patch cannot be worn during these tests. You cannot remove and re-apply the  ZIO XT patch monitor.  Your ZIO patch monitor will be mailed 3 day USPS to your address on file. It may take 3-5 days  to receive your monitor after you have been enrolled.  Once you have received your monitor, please review the enclosed instructions. Your monitor  has already been registered assigning a specific monitor serial # to  you.  Billing and Patient Assistance Program Information  We have supplied [employer] with any of your insurance information on file for billing purposes. [employer] offers a sliding scale Patient Assistance Program for patients that do not have  insurance, or whose insurance does not completely cover the cost of the ZIO monitor.  You must apply for the Patient Assistance Program to qualify for this discounted rate.  To apply,  please call [employer] at [phone], select option 4, select option 2, ask to apply for  Patient Assistance Program. Theodore Demark will ask your household income, and how many people  are in your household. They will quote your out-of-pocket cost based on that information.  [employer] will also be able to set up a 32-month interest-free payment plan if needed.  Applying the monitor   Shave hair from upper left chest.  Hold abrader disc by orange tab. Rub abrader in 40 strokes over the upper left chest as  indicated in your monitor instructions.  Clean area with 4 enclosed alcohol pads. Let dry.  Apply patch as indicated in monitor instructions. Patch will be placed under collarbone on left  side of chest with arrow pointing upward.  Rub patch adhesive wings for 2 minutes. Remove white label marked "1". Remove the white  label marked "2". Rub patch adhesive wings for 2 additional minutes.  While looking in a mirror, press and release button in center of patch. A small green light will  flash 3-4 times. This will be your only indicator that the monitor has been turned on.  Do not shower for the first 24 hours. You may shower after the first 24 hours.  Press the button if you feel a symptom. You will hear a small click. Record Date, Time and  Symptom in the Patient Logbook.  When you are ready to remove the patch, follow instructions on the last 2 pages of Patient  Logbook. Stick patch monitor onto the last page of Patient Logbook.  Place Patient Logbook in the blue and white box.  Use locking tab on box and tape box closed  securely. The blue and white box has prepaid postage on it. Please place it in the mailbox as  soon as possible. Your physician should have your test results approximately 7 days after the  monitor has been [facility]  Call [place] [phone] you have questions regarding  your ZIO XT patch monitor. Call them immediately if you see an orange light blinking on your  monitor.  If your monitor falls off in less than 4 days, contact our Monitor department at [phone]  If your monitor becomes loose or falls off after 4 days call [employer] at [phone]  suggestions on securing your monitor   Important Information About [place]         Signed, MSherren Mocha MD  06/20/2022 1:44 PM    [place]

## 2022-06-21 ENCOUNTER — Encounter: Payer: Self-pay | Admitting: Cardiovascular Disease

## 2022-06-21 ENCOUNTER — Telehealth: Payer: Self-pay

## 2022-06-21 ENCOUNTER — Telehealth (HOSPITAL_COMMUNITY): Payer: Self-pay | Admitting: *Deleted

## 2022-06-21 MED ORDER — FLECAINIDE ACETATE 50 MG PO TABS: ORAL_TABLET | ORAL | 3 refills | Status: DC

## 2022-06-21 NOTE — Telephone Encounter (Signed)
Judson Roch - can you call in flecainide 50 mg, take 4 as needed for symptomatic atrial fibrillation, #16 with 3 refills? Also, can you check with Tami and see if we can get the stress echo done before they leave for their trip?   Thx   Called and spoke with patient. Her stress ECHO is currently scheduled for 06/28/22 and this will be before their trip (they leave 07/03/22 per pt). Flecainide called in to pharmacy as directed above by Dr Burt Knack.

## 2022-06-21 NOTE — Telephone Encounter (Signed)
Left message on voicemail per DPR in reference to upcoming appointment scheduled on 06/28/2022 at 2:00 with detailed instructions given per Stress Test Requisition Sheet for the test. LM to arrive 30 minutes early, and that it is imperative to arrive on time for appointment to keep from having the test rescheduled. If you need to cancel or reschedule your appointment, please call the office within 24 hours of your appointment. Failure to do so may result in a cancellation of your appointment, and a $50 no show fee. Phone number given for call back for any questions. [name] [name]

## 2022-06-23 MED ORDER — FLECAINIDE ACETATE 50 MG PO TABS: 50.0000 mg | ORAL_TABLET | Freq: Two times a day (BID) | ORAL | 3 refills | Status: DC

## 2022-06-23 NOTE — Telephone Encounter (Signed)
Spoke with [name] [name] and reviewed strip with frequent PAC's suspect symptomatic PAC's. Reviewed treatment options. We agreed best to go on maintenance flecainide based on frequency of symptoms. Stress echo scheduled next week. Please call in flecainide 50 mg BID to start today. thx

## 2022-06-26 ENCOUNTER — Encounter: Payer: Self-pay | Admitting: Cardiovascular Disease

## 2022-06-28 ENCOUNTER — Ambulatory Visit (HOSPITAL_COMMUNITY): Payer: Managed Care, Other (non HMO) | Attending: Internal Medicine

## 2022-06-28 ENCOUNTER — Ambulatory Visit (HOSPITAL_COMMUNITY): Payer: Managed Care, Other (non HMO)

## 2022-06-28 DIAGNOSIS — I480 Paroxysmal atrial fibrillation: Secondary | ICD-10-CM | POA: Diagnosis not present

## 2022-06-28 LAB — ECHOCARDIOGRAM STRESS TEST
Area-P 1/2: 3.89 cm2
S' Lateral: 2.9 cm

## 2022-06-28 MED ORDER — PERFLUTREN LIPID MICROSPHERE
1.0000 mL | INTRAVENOUS | Status: AC | PRN
  Administered 2022-06-28 (×3): 1 mL via INTRAVENOUS

## 2023-04-26 ENCOUNTER — Ambulatory Visit
Admission: RE | Admit: 2023-04-26 | Discharge: 2023-04-26 | Disposition: A | Discharge status: Home / Self Care | Payer: Managed Care, Other (non HMO) | Source: Ambulatory Visit | Attending: Internal Medicine | Admitting: Internal Medicine

## 2023-04-26 ENCOUNTER — Other Ambulatory Visit: Payer: Self-pay | Admitting: Internal Medicine

## 2023-04-26 DIAGNOSIS — R052 Subacute cough: Secondary | ICD-10-CM

## 2023-05-09 ENCOUNTER — Other Ambulatory Visit: Payer: Self-pay | Admitting: Internal Medicine

## 2023-05-09 DIAGNOSIS — R9389 Abnormal findings on diagnostic imaging of other specified body structures: Secondary | ICD-10-CM

## 2023-05-25 ENCOUNTER — Ambulatory Visit
Admission: RE | Admit: 2023-05-25 | Discharge: 2023-05-25 | Disposition: A | Discharge status: Home / Self Care | Payer: Managed Care, Other (non HMO) | Source: Ambulatory Visit | Attending: Internal Medicine | Admitting: Internal Medicine

## 2023-05-25 DIAGNOSIS — R9389 Abnormal findings on diagnostic imaging of other specified body structures: Secondary | ICD-10-CM

## 2023-06-14 ENCOUNTER — Other Ambulatory Visit: Payer: Self-pay | Admitting: Cardiovascular Disease

## 2023-06-14 DIAGNOSIS — I480 Paroxysmal atrial fibrillation: Secondary | ICD-10-CM

## 2024-02-13 ENCOUNTER — Ambulatory Visit: Attending: Internal Medicine | Admitting: Audiologist

## 2024-02-13 DIAGNOSIS — H903 Sensorineural hearing loss, bilateral: Secondary | ICD-10-CM | POA: Diagnosis present

## 2024-02-13 NOTE — Procedures (Signed)
  [facility] [address] [place], [place] [address] [phone]  AUDIOLOGICAL EVALUATION  Name: [name] [name]    Status: Outpatient DOB: 1961/05/05     Referent: Tena Feeling, MD MRN: [id number] Date: 02/13/2024     Diagnosis: Sensorineural hearing loss, bilateral   HISTORY: [name], 63 y.o., was seen for an audiological evaluation. Jennice notices increased difficulty hearing over the past several years. In particular she struggles to hear high frequency sounds. She does not like loud sounds. She uses closed captioning to hear the TV, and her family often tells her that she talks too loud. Harli notes no ear pain, pressure, or dizziness. She notes occasional tinnitus, bilaterally. Adjoa reports no history of noise exposure, or history of ear surgery or ear infections.          EVALUATION: Otoscopic inspection reveals deep occluding cerumen AU.   Tympanometry was completed to assess middle ear status. Normal, Type A tympanograms were obtained bilaterally.  Standard audiometric techniques were used to obtain thresholds under high frequency headphones. Speech reception thresholds are 30 dBHL on the right and 25 dBHL on the left using recorded spondee word lists. Word recognition was 80% at 70 dBHL on the right and 84% at 65 dBHL on the left using recorded NU-6 word lists, in quiet.  A mild sloping to moderately severe sensorineural hearing loss is present bilaterally.   CONCLUSION:  [name] has a mild sloping to moderately severe sensorineural hearing loss bilaterally.  Word recognition is good in quiet at conversational speech levels bilaterally. Findings were reviewed with the patient.   RECOMMENDATIONS: Cerumen management  Use of binaural amplification to assist in daily listening situations. A list of area hearing aid providers was given to the patient. Use of communication strategies to improve communication in difficult listening  situations.  Audiogram scanned in under media tab. 35 min spent testing and counseling.  Burgess Caroline, Au.D., CCC-A Audiologist 02/13/2024  cc: Tena Feeling, MD

## 2024-06-18 ENCOUNTER — Encounter: Payer: Self-pay | Admitting: Obstetrics and Gynecology
